# Patient Record
Sex: Female | Born: 1990 | Race: Black or African American | Hispanic: No | Marital: Single | Smoking: Never smoker
Health system: Southern US, Community
[De-identification: ages and names within clinical notes are randomized; demographics above are authoritative.]

## PROBLEM LIST (undated history)

## (undated) ENCOUNTER — Inpatient Hospital Stay (HOSPITAL_COMMUNITY): Payer: Self-pay

## (undated) DIAGNOSIS — R87629 Unspecified abnormal cytological findings in specimens from vagina: Secondary | ICD-10-CM

## (undated) DIAGNOSIS — B009 Herpesviral infection, unspecified: Secondary | ICD-10-CM

## (undated) HISTORY — PX: TONSILLECTOMY: SUR1361

---

## 2015-01-28 ENCOUNTER — Emergency Department (HOSPITAL_COMMUNITY)
Admission: EM | Admit: 2015-01-28 | Discharge: 2015-01-28 | Disposition: A | Discharge status: Home / Self Care | Attending: Emergency Medicine | Admitting: Emergency Medicine

## 2015-01-28 ENCOUNTER — Encounter (HOSPITAL_COMMUNITY): Payer: Self-pay | Admitting: Emergency Medicine

## 2015-01-28 DIAGNOSIS — Z3200 Encounter for pregnancy test, result unknown: Secondary | ICD-10-CM | POA: Diagnosis present

## 2015-01-28 DIAGNOSIS — R110 Nausea: Secondary | ICD-10-CM | POA: Insufficient documentation

## 2015-01-28 DIAGNOSIS — Z3201 Encounter for pregnancy test, result positive: Secondary | ICD-10-CM | POA: Insufficient documentation

## 2015-01-28 DIAGNOSIS — R21 Rash and other nonspecific skin eruption: Secondary | ICD-10-CM | POA: Insufficient documentation

## 2015-01-28 DIAGNOSIS — N898 Other specified noninflammatory disorders of vagina: Secondary | ICD-10-CM | POA: Insufficient documentation

## 2015-01-28 LAB — I-STAT BETA HCG BLOOD, ED (MC, WL, AP ONLY): I-stat hCG, quantitative: >2000 m[IU]/mL — ABNORMAL HIGH (ref <5)

## 2015-01-28 LAB — CBC WITH DIFFERENTIAL/PLATELET
Basophils Absolute: 0 10*3/uL (ref 0.0–0.1)
Basophils Relative: 0 % (ref 0–1)
Eosinophils Absolute: 0 10*3/uL (ref 0.0–0.7)
Eosinophils Relative: 0 % (ref 0–5)
HCT: 37.2 % (ref 36.0–46.0)
HEMOGLOBIN: 12.9 g/dL (ref 12.0–15.0)
LYMPHS ABS: 1.7 10*3/uL (ref 0.7–4.0)
LYMPHS PCT: 37 % (ref 12–46)
MCH: 29.2 pg (ref 26.0–34.0)
MCHC: 34.7 g/dL (ref 30.0–36.0)
MCV: 84.2 fL (ref 78.0–100.0)
MONO ABS: 0.5 10*3/uL (ref 0.1–1.0)
Monocytes Relative: 10 % (ref 3–12)
NEUTROS ABS: 2.5 10*3/uL (ref 1.7–7.7)
NEUTROS PCT: 53 % (ref 43–77)
Platelets: 88 10*3/uL — ABNORMAL LOW (ref 150–400)
RBC: 4.42 MIL/uL (ref 3.87–5.11)
RDW: 13 % (ref 11.5–15.5)
WBC: 4.7 10*3/uL (ref 4.0–10.5)

## 2015-01-28 LAB — WET PREP, GENITAL
CLUE CELLS WET PREP: NONE SEEN
Trich, Wet Prep: NONE SEEN
WBC, Wet Prep HPF POC: NONE SEEN
YEAST WET PREP: NONE SEEN

## 2015-01-28 LAB — BASIC METABOLIC PANEL
ANION GAP: 8 (ref 5–15)
BUN: 9 mg/dL (ref 6–20)
CHLORIDE: 108 mmol/L (ref 101–111)
CO2: 23 mmol/L (ref 22–32)
Calcium: 10.5 mg/dL — ABNORMAL HIGH (ref 8.9–10.3)
Creatinine, Ser: 0.73 mg/dL (ref 0.44–1.00)
GFR calc Af Amer: >60 mL/min (ref >60)
Glucose, Bld: 84 mg/dL (ref 65–99)
POTASSIUM: 3.8 mmol/L (ref 3.5–5.1)
Sodium: 139 mmol/L (ref 135–145)

## 2015-01-28 LAB — URINALYSIS, ROUTINE W REFLEX MICROSCOPIC
Bilirubin Urine: NEGATIVE
GLUCOSE, UA: NEGATIVE mg/dL
Hgb urine dipstick: NEGATIVE
Ketones, ur: NEGATIVE mg/dL
LEUKOCYTES UA: NEGATIVE
Nitrite: NEGATIVE
PROTEIN: NEGATIVE mg/dL
Specific Gravity, Urine: 1.016 (ref 1.005–1.030)
UROBILINOGEN UA: 0.2 mg/dL (ref 0.0–1.0)
pH: 6 (ref 5.0–8.0)

## 2015-01-28 NOTE — ED Notes (Signed)
Pt states that she hasnt had period in June but didn't have one in July. Pt states that she hasnt had Depo shot since last summer.  Pt states that in the mornings she has been nauseated that is either relieved with burping or bowel movements x week.  Pt states that she has rash in vaginal area that is itching.  Pt has tried taking OTC medications for yeast infections but thinks it has gotten worse.

## 2015-01-28 NOTE — Discharge Instructions (Signed)
Please return without fail for worsening symptoms, including abdominal pain, vaginal bleeding, fevers, passing out, or any other symptoms concerning to you. Please call your OB/GYN physician regarding her positive pregnancy test. Prenatal Care  WHAT IS PRENATAL CARE?  Prenatal care means health care during your pregnancy, before your baby is born. It is very important to take care of yourself and your baby during your pregnancy by:   Getting early prenatal care. If you know you are pregnant, or think you might be pregnant, call your health care provider as soon as possible. Schedule a visit for a prenatal exam.  Getting regular prenatal care. Follow your health care provider's schedule for blood and other necessary tests. Do not miss appointments.  Doing everything you can to keep yourself and your baby healthy during your pregnancy.  Getting complete care. Prenatal care should include evaluation of the medical, dietary, educational, psychological, and social needs of you and your significant other. The medical and genetic history of your family and the family of your baby's father should be discussed with your health care provider.  Discussing with your health care provider:  Prescription, over-the-counter, and herbal medicines that you take.  Any history of substance abuse, alcohol use, smoking, and illegal drug use.  Any history of domestic abuse and violence.  Immunizations you have received.  Your nutrition and diet.  The amount of exercise you do.  Any environmental and occupational hazards to which you are exposed.  History of sexually transmitted infections for both you and your partner.  Previous pregnancies you have had. WHY IS PRENATAL CARE SO IMPORTANT?  By regularly seeing your health care provider, you help ensure that problems can be identified early so that they can be treated as soon as possible. Other problems might be prevented. Many studies have shown that early  and regular prenatal care is important for the health of mothers and their babies.  HOW CAN I TAKE CARE OF MYSELF WHILE I AM PREGNANT?  Here are ways to take care of yourself and your baby:   Start or continue taking your multivitamin with 400 micrograms (mcg) of folic acid every day.  Get early and regular prenatal care. It is very important to see a health care provider during your pregnancy. Your health care provider will check at each visit to make sure that you and your baby are healthy. If there are any problems, action can be taken right away to help you and your baby.  Eat a healthy diet that includes:  Fruits.  Vegetables.  Foods low in saturated fat.  Whole grains.  Calcium-rich foods, such as milk, yogurt, and hard cheeses.  Drink 6-8 glasses of liquids a day.  Unless your health care provider tells you not to, try to be physically active for 30 minutes, most days of the week. If you are pressed for time, you can get your activity in through 10-minute segments, three times a day.  Do not smoke, drink alcohol, or use drugs. These can cause long-term damage to your baby. Talk with your health care provider about steps to take to stop smoking. Talk with a member of your faith community, a counselor, a trusted friend, or your health care provider if you are concerned about your alcohol or drug use.  Ask your health care provider before taking any medicine, even over-the-counter medicines. Some medicines are not safe to take during pregnancy.  Get plenty of rest and sleep.  Avoid hot tubs and saunas during pregnancy.  Do not have X-rays taken unless absolutely necessary and with the recommendation of your health care provider. A lead shield can be placed on your abdomen to protect your baby when X-rays are taken in other parts of your body.  Do not empty the cat litter when you are pregnant. It may contain a parasite that causes an infection called toxoplasmosis, which can  cause birth defects. Also, use gloves when working in garden areas used by cats.  Do not eat uncooked or undercooked meats or fish.  Do not eat soft, mold-ripened cheeses (Brie, Camembert, and chevre) or soft, blue-veined cheese (Danish blue and Roquefort).  Stay away from toxic chemicals like:  Insecticides.  Solvents (some cleaners or paint thinners).  Lead.  Mercury.  Sexual intercourse may continue until the end of the pregnancy, unless you have a medical problem or there is a problem with the pregnancy and your health care provider tells you not to.  Do not wear high-heel shoes, especially during the second half of the pregnancy. You can lose your balance and fall.  Do not take long trips, unless absolutely necessary. Be sure to see your health care provider before going on the trip.  Do not sit in one position for more than 2 hours when on a trip.  Take a copy of your medical records when going on a trip. Know where a hospital is located in the city you are visiting, in case of an emergency.  Most dangerous household products will have pregnancy warnings on their labels. Ask your health care provider about products if you are unsure.  Limit or eliminate your caffeine intake from coffee, tea, sodas, medicines, and chocolate.  Many women continue working through pregnancy. Staying active might help you stay healthier. If you have a question about the safety or the hours you work at your particular job, talk with your health care provider.  Get informed:  Read books.  Watch videos.  Go to childbirth classes for you and your significant other.  Talk with experienced moms.  Ask your health care provider about childbirth education classes for you and your partner. Classes can help you and your partner prepare for the birth of your baby.  Ask about a baby doctor (pediatrician) and methods and pain medicine for labor, delivery, and possible cesarean delivery. HOW OFTEN  SHOULD I SEE MY HEALTH CARE PROVIDER DURING PREGNANCY?  Your health care provider will give you a schedule for your prenatal visits. You will have visits more often as you get closer to the end of your pregnancy. An average pregnancy lasts about 40 weeks.  A typical schedule includes visiting your health care provider:   About once each month during your first 6 months of pregnancy.  Every 2 weeks during the next 2 months.  Weekly in the last month, until the delivery date. Your health care provider will probably want to see you more often if:  You are older than 35 years.  Your pregnancy is high risk because you have certain health problems or problems with the pregnancy, such as:  Diabetes.  High blood pressure.  The baby is not growing on schedule, according to the dates of the pregnancy. Your health care provider will do special tests to make sure you and your baby are not having any serious problems. WHAT HAPPENS DURING PRENATAL VISITS?   At your first prenatal visit, your health care provider will do a physical exam and talk to you about your health history and  the health history of your partner and your family. Your health care provider will be able to tell you what date to expect your baby to be born on.  Your first physical exam will include checks of your blood pressure, measurements of your height and weight, and an exam of your pelvic organs. Your health care provider will do a Pap test if you have not had one recently and will do cultures of your cervix to make sure there is no infection.  At each prenatal visit, there will be tests of your blood, urine, blood pressure, weight, and the progress of the baby will be checked.  At your later prenatal visits, your health care provider will check how you are doing and how your baby is developing. You may have a number of tests done as your pregnancy progresses.  Ultrasound exams are often used to check on your baby's growth and  health.  You may have more urine and blood tests, as well as special tests, if needed. These may include amniocentesis to examine fluid in the pregnancy sac, stress tests to check how the baby responds to contractions, or a biophysical profile to measure your baby's well-being. Your health care provider will explain the tests and why they are necessary.  You should be tested for high blood sugar (gestational diabetes) between the 24th and 28th weeks of your pregnancy.  You should discuss with your health care provider your plans to breastfeed or bottle-feed your baby.  Each visit is also a chance for you to learn about staying healthy during pregnancy and to ask questions. Document Released: 06/09/2003 Document Revised: 06/11/2013 Document Reviewed: 08/21/2013 [facility] Patient Information 2015 [place], [place]. This information is not intended to replace advice given to you by your health care provider. Make sure you discuss any questions you have with your health care provider.

## 2015-01-28 NOTE — ED Notes (Signed)
Patient unable to void at this time

## 2015-01-28 NOTE — ED Notes (Addendum)
Patient states she has not had a menstrual cycle since June of this year and she fears she might be pregnant.  Patient states she is not on birth control and has unprotected sex.  Patient states she had white vaginal discharge several weeks ago and treated herself with leftover Flagyl.  The discharge has resolved and she denies vaginal discharge of any kind.  Patient denies abdominal pain and cramping, but endorses N/V since yesterday.  Patient reports 2 episodes of vomiting this morning.  Patient's lung sounds are clear, heart sounds WNL, S1/S2.  Abdomen is soft and non-tender to palpation.  Bowel sounds present.

## 2015-01-28 NOTE — ED Notes (Signed)
RN ADVISED STARTING AN IV AND THAT SHE WOULD COLLECT BLOOD SAMPLES

## 2015-01-28 NOTE — ED Provider Notes (Signed)
CSN: [id number]     Arrival date & time 01/28/15  1033 History   First MD Initiated Contact with Patient 01/28/15 1051     Chief Complaint  Patient presents with  . Nausea  . Possible Pregnancy  . Rash  . Vaginal Itching     (Consider location/radiation/quality/duration/timing/severity/associated sxs/prior Treatment) HPI 24 year old female who presents with nausea and vaginal itching. For the past 1.5 weeks, has had nausea and constipated. Occasionally had vomiting, including yesterday and this morning. Denies vaginal bleeding, but has noted white discharge. She has also had vaginal itching. Sexually active with one partner, but no concern for STI. Denies fever, chills, diarrhea. Notes more constipation, but had normal bowel movement this morning. Denies abdominal pain, but feels heavy in her low abdomen. Denies dysuria or urinary frequency. No abdominal surgeries in the past  History reviewed. No pertinent past medical history. Past Surgical History  Procedure Laterality Date  . Tonsillectomy     History reviewed. No pertinent family history. Social History  Substance Use Topics  . Smoking status: Never Smoker   . Smokeless tobacco: None  . Alcohol Use: No   OB History    No data available     Review of Systems  10/14 systems reviewed and are negative other than those stated in the HPI   Allergies  Flagyl  Home Medications   Prior to Admission medications   Not on File   BP 110/57 mmHg  Pulse 66  Temp(Src) 98.5 F (36.9 C) (Oral)  Resp 16  Ht  (1.651 m)  Wt 134 lb (60.782 kg)  BMI 22.30 kg/m2  SpO2 100%  LMP 11/28/2014 Physical Exam  Physical Exam  Nursing note and vitals reviewed. Constitutional: Well developed, well nourished, non-toxic, and in no acute distress Head: Normocephalic and atraumatic.  Mouth/Throat: Oropharynx is clear and moist.  Neck: Normal range of motion. Neck supple.  Cardiovascular: Normal rate and regular rhythm.    Pulmonary/Chest: Effort normal and breath sounds normal.  Abdominal: Soft. There is no tenderness. There is no rebound and no guarding.  Musculoskeletal: Normal range of motion.  Neurological: Alert, no facial droop, fluent speech, moves all extremities symmetrically Skin: Skin is warm and dry.  Psychiatric: Cooperative Pelvic: Normal external genitalia. Normal internal genitalia. No discharge. No blood within the vagina. No cervical motion tenderness. No adnexal masses or tenderness.  ED Course  Procedures (including critical care time) Labs Review Labs Reviewed  BASIC METABOLIC PANEL - Abnormal; Notable for the following:    Calcium 10.5 (*)    All other components within normal limits  CBC WITH DIFFERENTIAL/PLATELET - Abnormal; Notable for the following:    Platelets 88 (*)    All other components within normal limits  I-STAT BETA HCG BLOOD, ED ([facility], [facility], [facility] ONLY) - Abnormal; Notable for the following:    I-stat hCG, quantitative >2000.0 (*)    All other components within normal limits  WET PREP, GENITAL  URINALYSIS, ROUTINE W REFLEX MICROSCOPIC (NOT AT [facility])  RPR  HIV ANTIBODY (ROUTINE TESTING)  GC/CHLAMYDIA PROBE AMP ([place]) NOT AT [facility]    Imaging Review No results found.   EKG Interpretation None      MDM   Final diagnoses:  Positive pregnancy test   In short, this is a 24 year old female, otherwise healthy, who presents with nausea and late menses with vaginal itching. She has non-concerning vital signs and normal abdominal exam. Pelvic exam unremarkable as well, and wet prep  is negative. GC/Chlamydia pending, but patient currently not concerned for STI. Pregnancy test is positive. Currently no abdominal pain, vaginal bleeding, or other concerning features concerning for ectopic pregnancy at this time. Discussed warning signs for ectopic and miscarriage. She has OB physician that she would like to follow-up with. Strict return and follow-up instructions reviewed.  She expressed understanding of all discharge instructions and felt comfortable with the plan of care.     Lavera Guise, MD 01/28/15 [phone]

## 2015-01-29 LAB — GC/CHLAMYDIA PROBE AMP (~~LOC~~) NOT AT ARMC
Chlamydia: NEGATIVE
Neisseria Gonorrhea: NEGATIVE

## 2015-01-29 LAB — HIV ANTIBODY (ROUTINE TESTING W REFLEX): HIV Screen 4th Generation wRfx: NONREACTIVE

## 2015-01-29 LAB — RPR: RPR Ser Ql: NONREACTIVE

## 2015-02-10 ENCOUNTER — Encounter (HOSPITAL_COMMUNITY): Payer: Self-pay | Admitting: Emergency Medicine

## 2015-02-10 ENCOUNTER — Emergency Department (HOSPITAL_COMMUNITY)
Admission: EM | Admit: 2015-02-10 | Discharge: 2015-02-10 | Discharge status: Against Medical Advice | Attending: Emergency Medicine | Admitting: Emergency Medicine

## 2015-02-10 DIAGNOSIS — O210 Mild hyperemesis gravidarum: Secondary | ICD-10-CM | POA: Diagnosis present

## 2015-02-10 DIAGNOSIS — O9989 Other specified diseases and conditions complicating pregnancy, childbirth and the puerperium: Secondary | ICD-10-CM | POA: Diagnosis not present

## 2015-02-10 DIAGNOSIS — N898 Other specified noninflammatory disorders of vagina: Secondary | ICD-10-CM | POA: Insufficient documentation

## 2015-02-10 DIAGNOSIS — Z3A12 12 weeks gestation of pregnancy: Secondary | ICD-10-CM | POA: Diagnosis not present

## 2015-02-10 DIAGNOSIS — R51 Headache: Secondary | ICD-10-CM | POA: Insufficient documentation

## 2015-02-10 NOTE — ED Notes (Signed)
Pt reports that she thinks she is approx [redacted] weeks pregnant. Today her nv is worse and now c/o of headache. Denies abdominal cramping. Pt also reporting vaginal discharge.

## 2015-02-10 NOTE — ED Notes (Signed)
Per registration Pt decided to leave due to the wait time. They informed the Pt they were next to go back and they also tried to explain the wait time and process to Pt but she decided to leave.

## 2015-02-11 ENCOUNTER — Encounter (HOSPITAL_COMMUNITY): Payer: Self-pay | Admitting: *Deleted

## 2015-02-11 ENCOUNTER — Inpatient Hospital Stay (HOSPITAL_COMMUNITY)

## 2015-02-11 ENCOUNTER — Inpatient Hospital Stay (HOSPITAL_COMMUNITY)
Admission: AD | Admit: 2015-02-11 | Discharge: 2015-02-11 | Disposition: A | Discharge status: Home / Self Care | Source: Ambulatory Visit | Attending: Obstetrics and Gynecology | Admitting: Obstetrics and Gynecology

## 2015-02-11 DIAGNOSIS — O210 Mild hyperemesis gravidarum: Secondary | ICD-10-CM | POA: Insufficient documentation

## 2015-02-11 DIAGNOSIS — O219 Vomiting of pregnancy, unspecified: Secondary | ICD-10-CM | POA: Diagnosis not present

## 2015-02-11 DIAGNOSIS — B9689 Other specified bacterial agents as the cause of diseases classified elsewhere: Secondary | ICD-10-CM

## 2015-02-11 DIAGNOSIS — N760 Acute vaginitis: Secondary | ICD-10-CM

## 2015-02-11 DIAGNOSIS — R109 Unspecified abdominal pain: Secondary | ICD-10-CM | POA: Diagnosis not present

## 2015-02-11 DIAGNOSIS — Z3491 Encounter for supervision of normal pregnancy, unspecified, first trimester: Secondary | ICD-10-CM

## 2015-02-11 DIAGNOSIS — Z3A09 9 weeks gestation of pregnancy: Secondary | ICD-10-CM | POA: Insufficient documentation

## 2015-02-11 DIAGNOSIS — O26899 Other specified pregnancy related conditions, unspecified trimester: Secondary | ICD-10-CM

## 2015-02-11 HISTORY — DX: Unspecified abnormal cytological findings in specimens from vagina: R87.629

## 2015-02-11 LAB — COMPREHENSIVE METABOLIC PANEL
ALT: 15 U/L (ref 14–54)
AST: 22 U/L (ref 15–41)
Albumin: 4.2 g/dL (ref 3.5–5.0)
Alkaline Phosphatase: 48 U/L (ref 38–126)
Anion gap: 7 (ref 5–15)
BILIRUBIN TOTAL: 0.8 mg/dL (ref 0.3–1.2)
BUN: 9 mg/dL (ref 6–20)
CO2: 24 mmol/L (ref 22–32)
Calcium: 10.8 mg/dL — ABNORMAL HIGH (ref 8.9–10.3)
Chloride: 103 mmol/L (ref 101–111)
Creatinine, Ser: 0.65 mg/dL (ref 0.44–1.00)
GFR calc Af Amer: >60 mL/min (ref >60)
Glucose, Bld: 193 mg/dL — ABNORMAL HIGH (ref 65–99)
Potassium: 4 mmol/L (ref 3.5–5.1)
Sodium: 134 mmol/L — ABNORMAL LOW (ref 135–145)
TOTAL PROTEIN: 7 g/dL (ref 6.5–8.1)

## 2015-02-11 LAB — CBC
HCT: 34.5 % — ABNORMAL LOW (ref 36.0–46.0)
HEMOGLOBIN: 12.4 g/dL (ref 12.0–15.0)
MCH: 30.2 pg (ref 26.0–34.0)
MCHC: 35.9 g/dL (ref 30.0–36.0)
MCV: 83.9 fL (ref 78.0–100.0)
Platelets: 99 10*3/uL — ABNORMAL LOW (ref 150–400)
RBC: 4.11 MIL/uL (ref 3.87–5.11)
RDW: 13.2 % (ref 11.5–15.5)
WBC: 5.7 10*3/uL (ref 4.0–10.5)

## 2015-02-11 LAB — WET PREP, GENITAL
Trich, Wet Prep: NONE SEEN
Yeast Wet Prep HPF POC: NONE SEEN

## 2015-02-11 LAB — HCG, QUANTITATIVE, PREGNANCY: HCG, BETA CHAIN, QUANT, S: 149586 m[IU]/mL — AB (ref <5)

## 2015-02-11 LAB — URINALYSIS, ROUTINE W REFLEX MICROSCOPIC
Bilirubin Urine: NEGATIVE
Glucose, UA: NEGATIVE mg/dL
HGB URINE DIPSTICK: NEGATIVE
Ketones, ur: 40 mg/dL — AB
Nitrite: NEGATIVE
Protein, ur: NEGATIVE mg/dL
SPECIFIC GRAVITY, URINE: 1.02 (ref 1.005–1.030)
Urobilinogen, UA: 0.2 mg/dL (ref 0.0–1.0)
pH: 6 (ref 5.0–8.0)

## 2015-02-11 LAB — URINE MICROSCOPIC-ADD ON

## 2015-02-11 LAB — ABO/RH: ABO/RH(D): B POS

## 2015-02-11 MED ORDER — LACTATED RINGERS IV BOLUS (SEPSIS)
1000.0000 mL | Freq: Once | INTRAVENOUS | Status: AC
  Administered 2015-02-11: 1000 mL via INTRAVENOUS

## 2015-02-11 MED ORDER — METRONIDAZOLE 0.75 % VA GEL: 1.0000 | Freq: Every day | VAGINAL | Status: DC

## 2015-02-11 MED ORDER — PROMETHAZINE HCL 25 MG PO TABS: 12.5000 mg | ORAL_TABLET | Freq: Four times a day (QID) | ORAL | Status: DC | PRN

## 2015-02-11 MED ORDER — DEXTROSE IN LACTATED RINGERS 5 % IV SOLN
25.0000 mg | Freq: Once | INTRAVENOUS | Status: AC
  Administered 2015-02-11: 25 mg via INTRAVENOUS
  Filled 2015-02-11: qty 1

## 2015-02-11 NOTE — MAU Note (Signed)
vommiting last 2 days, abd. Pain mid. Abd. Due to straining

## 2015-02-11 NOTE — MAU Provider Note (Signed)
Chief Complaint: Abdominal Pain and Emesis During Pregnancy   First Provider Initiated Contact with Patient 02/11/15 1131      SUBJECTIVE HPI: Melinda Meza is a 24 y.o. G1P0 at [redacted]w[redacted]d by LMP who presents to maternity admissions reporting nausea and vomiting x several weeks, worsening yesterday causing vomiting 8-9 x in last 24 hours.  She also reports diarrhea with 2-3 BM in last 24 hours.  The nausea/vomitting is associated with cramping abdominal pain that is intermittent.  She has not yet started prenatal care in this pregnancy and does not have a provider.  She also reports vaginal discharge with odor. She denies vaginal bleeding, vaginal itching/burning, urinary symptoms, h/a, dizziness, or fever/chills.     Abdominal Pain This is a new problem. The current episode started in the past 7 days. The onset quality is gradual. The problem occurs intermittently. The problem has been waxing and waning. The pain is located in the epigastric region, LLQ and RLQ. The pain is mild. The quality of the pain is cramping and sharp. The abdominal pain radiates to the pelvis. Associated symptoms include diarrhea, nausea and vomiting. Pertinent negatives include no constipation, dysuria, fever, frequency, headaches or weight loss. The pain is aggravated by bowel movement and vomiting. She has tried nothing for the symptoms.  Emesis  This is a recurrent problem. The current episode started 1 to 4 weeks ago. The problem occurs 5 to 10 times per day. The problem has been rapidly worsening. The emesis has an appearance of bile and stomach contents. There has been no fever. Associated symptoms include abdominal pain and diarrhea. Pertinent negatives include no chest pain, chills, dizziness, fever, headaches or weight loss. She has tried nothing for the symptoms.    Past Medical History  Diagnosis Date  . Vaginal Pap smear, abnormal    Past Surgical History  Procedure Laterality Date  . Tonsillectomy      Social History   Social History  . Marital Status: Single    Spouse Name: N/A  . Number of Children: N/A  . Years of Education: N/A   Occupational History  . Not on file.   Social History Main Topics  . Smoking status: Never Smoker   . Smokeless tobacco: Not on file  . Alcohol Use: No  . Drug Use: No  . Sexual Activity: Yes    Birth Control/ Protection: None   Other Topics Concern  . Not on file   Social History Narrative   No current facility-administered medications on file prior to encounter.   No current outpatient prescriptions on file prior to encounter.   Allergies  Allergen Reactions  . Flagyl [Metronidazole] Nausea Only    ROS:  Review of Systems  Constitutional: Negative for fever, chills, weight loss and fatigue.  HENT: Negative for sinus pressure.   Eyes: Negative for photophobia.  Respiratory: Negative for shortness of breath.   Cardiovascular: Negative for chest pain.  Gastrointestinal: Positive for nausea, vomiting, abdominal pain and diarrhea. Negative for constipation.  Genitourinary: Negative for dysuria, frequency, flank pain, vaginal bleeding, vaginal discharge, difficulty urinating, vaginal pain and pelvic pain.  Musculoskeletal: Negative for neck pain.  Neurological: Negative for dizziness, weakness and headaches.  Psychiatric/Behavioral: Negative.      I have reviewed patient's Past Medical Hx, Surgical Hx, Family Hx, Social Hx, medications and allergies.   Physical Exam   Patient Vitals for the past 24 hrs:  BP Temp Temp src Pulse Resp SpO2 Height Weight  02/11/15 1104 - - - - - -  5\' 4"  (1.626 m) 59.875 kg (132 lb)  02/11/15 1100 116/65 mmHg 98.2 F (36.8 C) Oral 74 20 100 % - -   Constitutional: Well-developed, well-nourished female in no acute distress.  Cardiovascular: normal rate Respiratory: normal effort GI: Abd soft, non-tender. Pos BS x 4 MS: Extremities nontender, no edema, normal ROM Neurologic: Alert and oriented x  4.  GU: Neg CVAT.  PELVIC EXAM: Cervix pink, visually closed, without lesion, scant white creamy discharge, vaginal walls and external genitalia normal Bimanual exam: Cervix 0/long/high, firm, anterior, neg CMT, uterus nontender, nonenlarged, adnexa without tenderness, enlargement, or mass  LAB RESULTS Results for orders placed or performed during the hospital encounter of 02/11/15 (from the past 24 hour(s))  Urinalysis, Routine w reflex microscopic (not at [facility])     Status: Abnormal   Collection Time: 02/11/15 11:10 AM  Result Value Ref Range   Color, Urine YELLOW YELLOW   APPearance CLEAR CLEAR   Specific Gravity, Urine 1.020 1.005 - 1.030   pH 6.0 5.0 - 8.0   Glucose, UA NEGATIVE NEGATIVE mg/dL   Hgb urine dipstick NEGATIVE NEGATIVE   Bilirubin Urine NEGATIVE NEGATIVE   Ketones, ur 40 (A) NEGATIVE mg/dL   Protein, ur NEGATIVE NEGATIVE mg/dL   Urobilinogen, UA 0.2 0.0 - 1.0 mg/dL   Nitrite NEGATIVE NEGATIVE   Leukocytes, UA SMALL (A) NEGATIVE  Urine microscopic-add on     Status: None   Collection Time: 02/11/15 11:10 AM  Result Value Ref Range   Squamous Epithelial / LPF RARE RARE   WBC, UA 0-2 <3 WBC/hpf  Wet prep, genital     Status: Abnormal   Collection Time: 02/11/15 12:20 PM  Result Value Ref Range   Yeast Wet Prep HPF POC NONE SEEN NONE SEEN   Trich, Wet Prep NONE SEEN NONE SEEN   Clue Cells Wet Prep HPF POC FEW (A) NONE SEEN   WBC, Wet Prep HPF POC FEW (A) NONE SEEN       IMAGING US Ob Comp Less 14 Wks  02/11/2015   CLINICAL DATA:  Initial encounter for 3 day history pelvic pain  EXAM: OBSTETRIC <14 WK ULTRASOUND  TECHNIQUE: Transabdominal ultrasound was performed for evaluation of the gestation as well as the maternal uterus and adnexal regions.  COMPARISON:  None.  FINDINGS: Intrauterine gestational sac: Visualized/normal in shape.  Yolk sac:  Visualized.  Embryo:  Visualized.  Cardiac Activity: Visualized.  Heart Rate: 177  bpm  CRL:  22.5  mm   8 w   6 d                   [place] EDC: 09/17/2015  Maternal uterus/adnexae: Small subchronic hemorrhage is evident. Maternal right ovary is normal in appearance. Left ovary is not visualized. No intraperitoneal free fluid.  IMPRESSION: Single living intrauterine gestation at estimated 8 week 6 day gestational age by crown-rump length.   Electronically Signed   By: Kennith Center M.D.   On: 02/11/2015 14:27    MAU Management/MDM: Ordered labs and imaging and reviewed results.   Treatments in MAU included D5LR x 1000 ml, LR x 1000 ml, and Phenergan 25 mg IV.  Pt reports improvement in nausea and has not reported vomiting in the MAU. Pt stable at time of discharge.  ASSESSMENT 1. Nausea and vomiting during pregnancy prior to [redacted] weeks gestation   2. Abdominal pain affecting pregnancy, antepartum   3. Normal IUP (intrauterine pregnancy) on prenatal ultrasound, first trimester  PLAN Discharge home Phenergan 12.5-25 mg PO Q 6 hours PRN Discussed OTC nausea medications including Unisom/B6 Discussed SCH findings on [place] with pt and bleeding precautions given List of prenatal providers given    Medication List    TAKE these medications        promethazine 25 MG tablet  Commonly known as:  PHENERGAN  Take 0.5-1 tablets (12.5-25 mg total) by mouth every 6 (six) hours as needed.       Follow-up Information    Please follow up.   Why:  Start prenatal care as soon as possible      Follow up with [facility] [facility]ERN[facility] ADMISS[facility].   Why:  As needed for emergencies   Contact information:   [address] [id number] mc [place] [address] [phone]      Sharen Counter Certified Nurse-Midwife 02/11/2015  2:54 PM

## 2015-02-11 NOTE — Discharge Instructions (Signed)
Nausea medication to take during pregnancy:  ° °Unisom (doxylamine succinate 25 mg tablets) Take one tablet daily at bedtime. If symptoms are not adequately controlled, the dose can be increased to a maximum recommended dose of two tablets daily (1/2 tablet in the morning, 1/2 tablet mid-afternoon and one at bedtime). ° °Vitamin B6 100mg tablets. Take one tablet twice a day (up to 200 mg per day). ° °Add Phenergan as prescribed to take as needed.  ° °Morning Sickness °Morning sickness is when you feel sick to your stomach (nauseous) during pregnancy. This nauseous feeling may or may not come with vomiting. It often occurs in the morning but can be a problem any time of day. Morning sickness is most common during the first trimester, but it may continue throughout pregnancy. While morning sickness is unpleasant, it is usually harmless unless you develop severe and continual vomiting (hyperemesis gravidarum). This condition requires more intense treatment.  °CAUSES  °The cause of morning sickness is not completely known but seems to be related to normal hormonal changes that occur in pregnancy. °RISK FACTORS °You are at greater risk if you: °· Experienced nausea or vomiting before your pregnancy. °· Had morning sickness during a previous pregnancy. °· Are pregnant with more than one baby, such as twins. °TREATMENT  °Do not use any medicines (prescription, over-the-counter, or herbal) for morning sickness without first talking to your health care provider. Your health care provider may prescribe or recommend: °· Vitamin B6 supplements. °· Anti-nausea medicines. °· The herbal medicine ginger. °HOME CARE INSTRUCTIONS  °· Only take over-the-counter or prescription medicines as directed by your health care provider. °· Taking multivitamins before getting pregnant can prevent or decrease the severity of morning sickness in most women. °· Eat a piece of dry toast or unsalted crackers before getting out of bed in the  morning. °· Eat five or six small meals a day. °· Eat dry and bland foods (rice, baked potato). Foods high in carbohydrates are often helpful. °· Do not drink liquids with your meals. Drink liquids between meals. °· Avoid greasy, fatty, and spicy foods. °· Get someone to cook for you if the smell of any food causes nausea and vomiting. °· If you feel nauseous after taking prenatal vitamins, take the vitamins at night or with a snack.  °· Snack on protein foods (nuts, yogurt, cheese) between meals if you are hungry. °· Eat unsweetened gelatins for desserts. °· Wearing an acupressure wristband (worn for sea sickness) may be helpful. °· Acupuncture may be helpful. °· Do not smoke. °· Get a humidifier to keep the air in your house free of odors. °· Get plenty of fresh air. °SEEK MEDICAL CARE IF:  °· Your home remedies are not working, and you need medicine. °· You feel dizzy or lightheaded. °· You are losing weight. °SEEK IMMEDIATE MEDICAL CARE IF:  °· You have persistent and uncontrolled nausea and vomiting. °· You pass out (faint). °MAKE SURE YOU: °· Understand these instructions. °· Will watch your condition. °· Will get help right away if you are not doing well or get worse. °Document Released: 07/28/2006 Document Revised: 06/11/2013 Document Reviewed: 11/21/2012 °ExitCare® Patient Information ©2015 [employer]. This information is not intended to replace advice given to you by your health care provider. Make sure you discuss any questions you have with your health care provider. ° °

## 2015-02-12 LAB — GC/CHLAMYDIA PROBE AMP (~~LOC~~) NOT AT ARMC
Chlamydia: NEGATIVE
Neisseria Gonorrhea: NEGATIVE

## 2015-02-12 LAB — HIV ANTIBODY (ROUTINE TESTING W REFLEX): HIV SCREEN 4TH GENERATION: NONREACTIVE

## 2015-12-17 ENCOUNTER — Encounter (HOSPITAL_COMMUNITY): Payer: Self-pay | Admitting: *Deleted

## 2016-06-20 NOTE — L&D Delivery Note (Signed)
Delivery Note At 3:06 PM a viable female Theora Gianotti)  was delivered via Vaginal, Spontaneous Delivery (Presentation: occiput anterior  ;  ).  APGAR: 9, 9; weight 7 lb 13 oz (3545 g).   Placenta status:Retained.... Informed consent obtained for manual removal of the placenta. Pt was given Nitroglycerine sublingual by Dr. Hart Rochester with Anesthesia. The placenta was then manually removed without difficulty. Placenta appeared complete.Retained membranes noted and removed with ring forceps.  , .    Anesthesia:Nitrous Oxide during delivery and Fentanyl post delivery    Episiotomy: None Lacerations: 2nd degree Suture Repair: 3.0 vicryl Est. Blood Loss (mL): 600 mL  Pt with postpartum hemorrhage due to atony after placenta removed. Pt received cytotec 1000 mcg per rectum and methergine 0.2 mg IM  Mom to postpartum.  Baby to Couplet care / Skin to Skin.  Flo Berroa J. 03/27/2017, 4:26 PM

## 2016-09-13 LAB — OB RESULTS CONSOLE RPR: RPR: NONREACTIVE

## 2016-09-13 LAB — OB RESULTS CONSOLE ABO/RH: RH Type: POSITIVE

## 2016-09-13 LAB — OB RESULTS CONSOLE ANTIBODY SCREEN: Antibody Screen: NEGATIVE

## 2016-09-13 LAB — OB RESULTS CONSOLE HIV ANTIBODY (ROUTINE TESTING): HIV: NONREACTIVE

## 2016-09-13 LAB — OB RESULTS CONSOLE HEPATITIS B SURFACE ANTIGEN: Hepatitis B Surface Ag: NEGATIVE

## 2016-09-13 LAB — OB RESULTS CONSOLE RUBELLA ANTIBODY, IGM: RUBELLA: IMMUNE

## 2016-09-20 ENCOUNTER — Other Ambulatory Visit (HOSPITAL_COMMUNITY)
Admission: RE | Admit: 2016-09-20 | Discharge: 2016-09-20 | Disposition: A | Discharge status: Home / Self Care | Payer: BLUE CROSS/BLUE SHIELD | Source: Ambulatory Visit | Attending: Obstetrics and Gynecology | Admitting: Obstetrics and Gynecology

## 2016-09-20 DIAGNOSIS — Z113 Encounter for screening for infections with a predominantly sexual mode of transmission: Secondary | ICD-10-CM | POA: Diagnosis present

## 2016-09-20 DIAGNOSIS — Z01419 Encounter for gynecological examination (general) (routine) without abnormal findings: Secondary | ICD-10-CM | POA: Diagnosis present

## 2016-09-21 ENCOUNTER — Other Ambulatory Visit: Payer: Self-pay | Admitting: Obstetrics and Gynecology

## 2016-09-26 LAB — CYTOLOGY - PAP
CHLAMYDIA, DNA PROBE: NEGATIVE
Diagnosis: NEGATIVE
NEISSERIA GONORRHEA: NEGATIVE

## 2016-11-09 IMAGING — US US OB COMP LESS 14 WK
1 series · 15 of 21 positions shown · non-contrast
Comparison: None.

CLINICAL DATA: Initial encounter for 3 day history pelvic pain

EXAM:
OBSTETRIC <14 WK ULTRASOUND
TECHNIQUE: Transabdominal ultrasound was performed for evaluation of the
gestation as well as the maternal uterus and adnexal regions.

[Series 1: us ob comp less 14 wk · 15 of 21 slices shown]
[im 1/21]
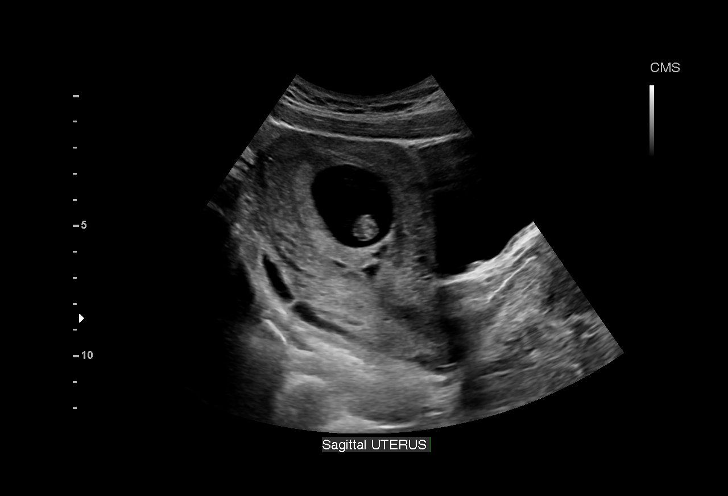
[im 3/21]
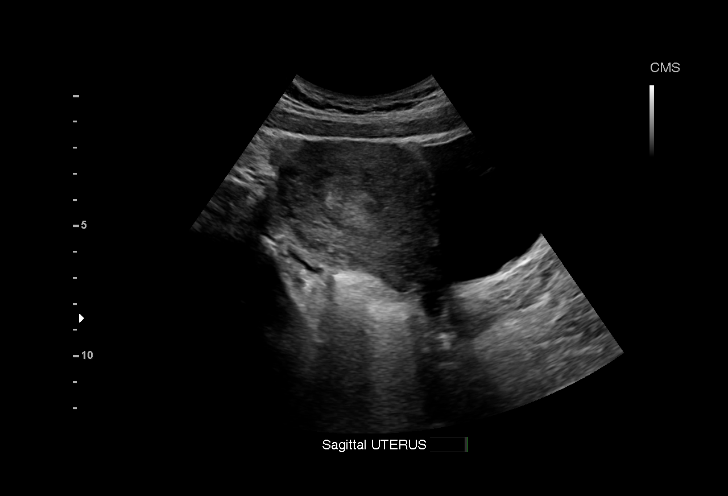
[im 4/21]
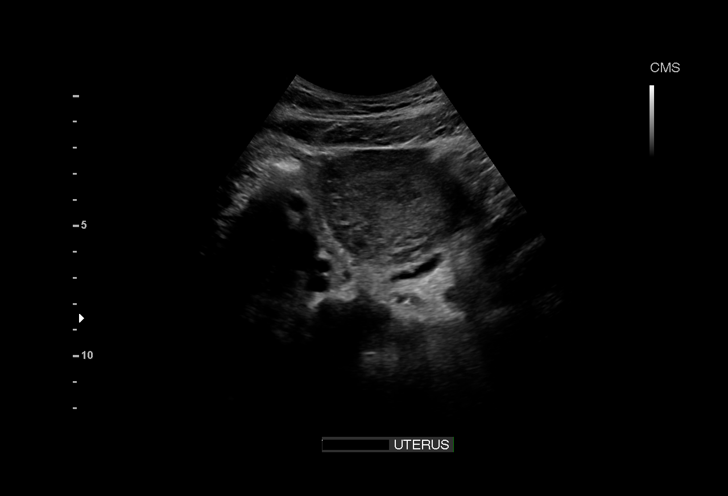
[im 5/21]
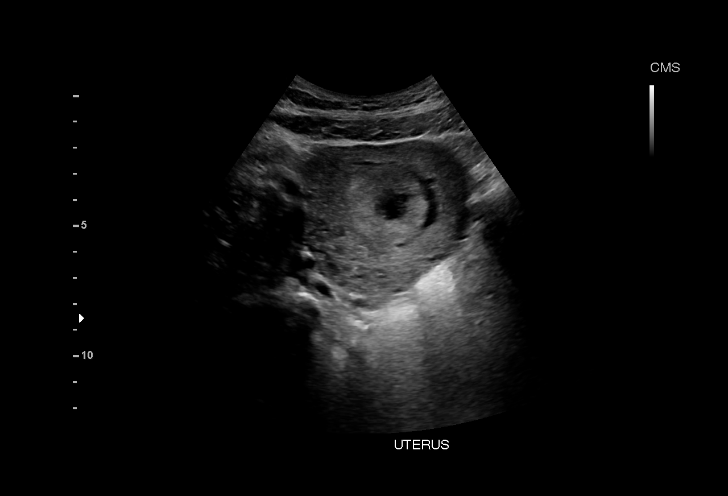
[im 7/21]
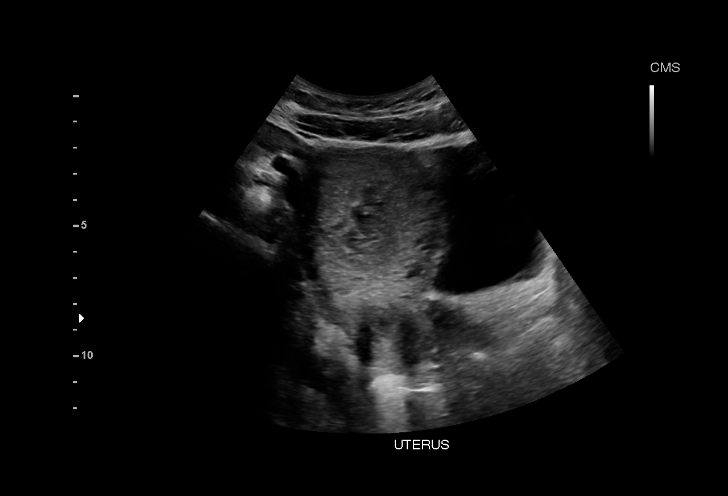
[im 8/21]
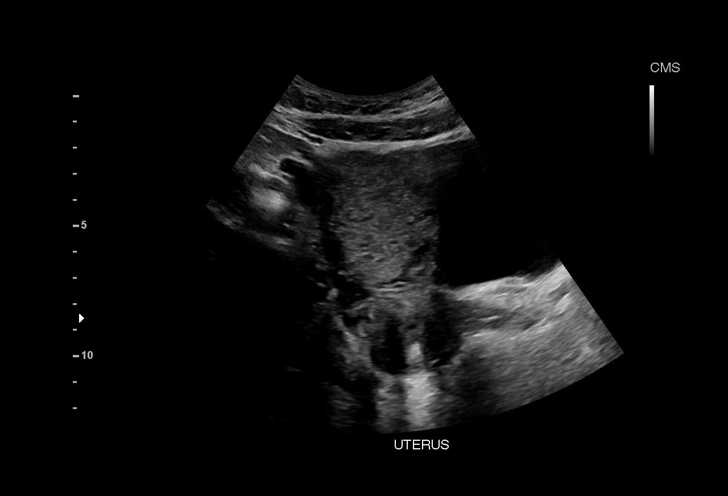
[im 10/21]
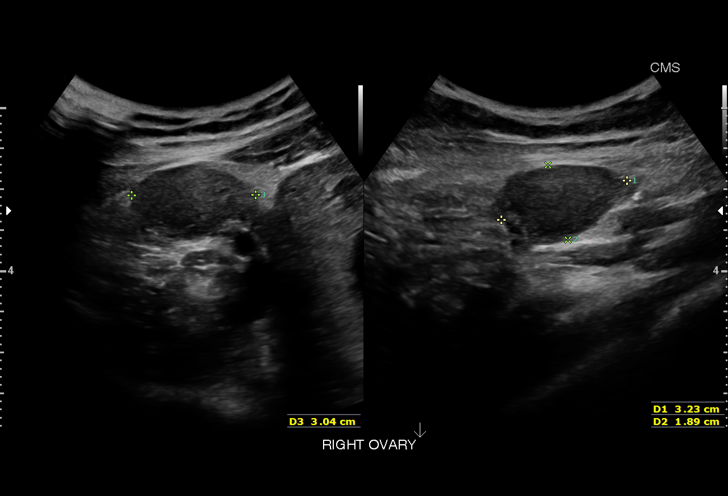
[im 11/21]
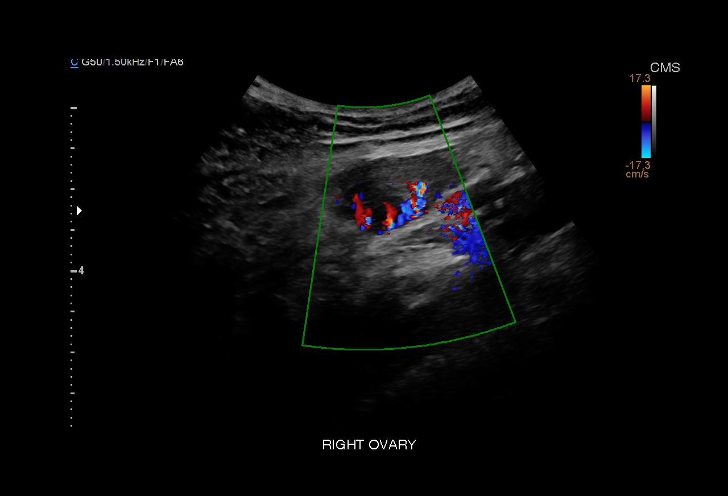
[im 12/21]
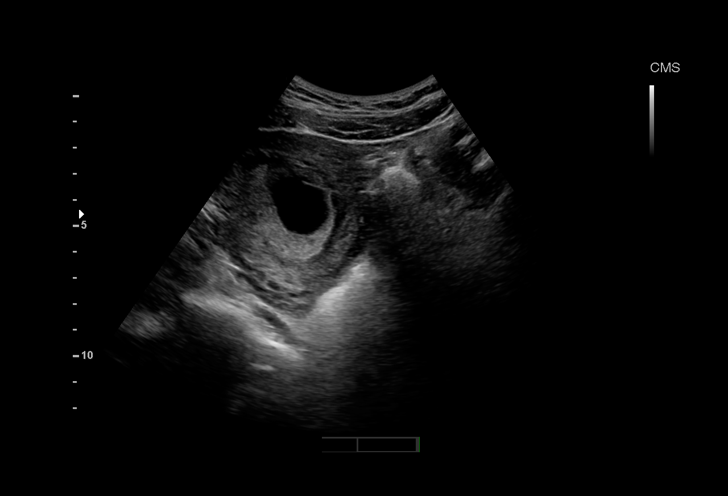
[im 14/21]
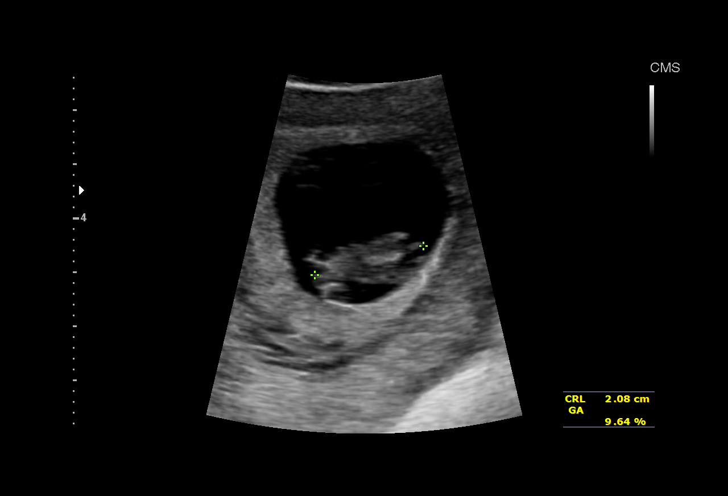
[im 15/21]
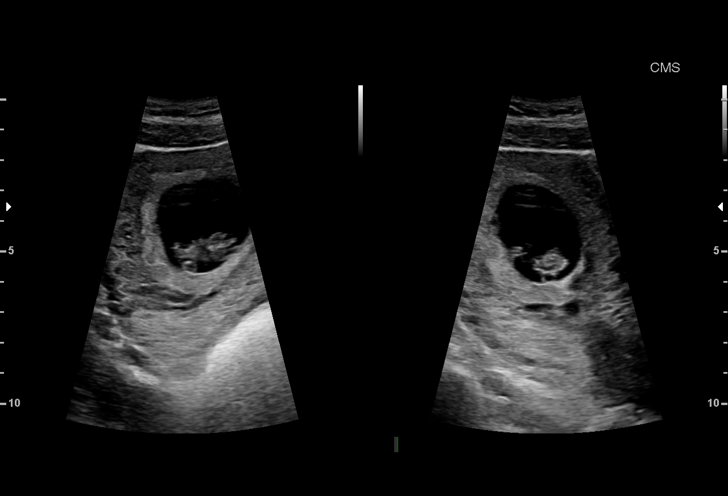
[im 17/21]
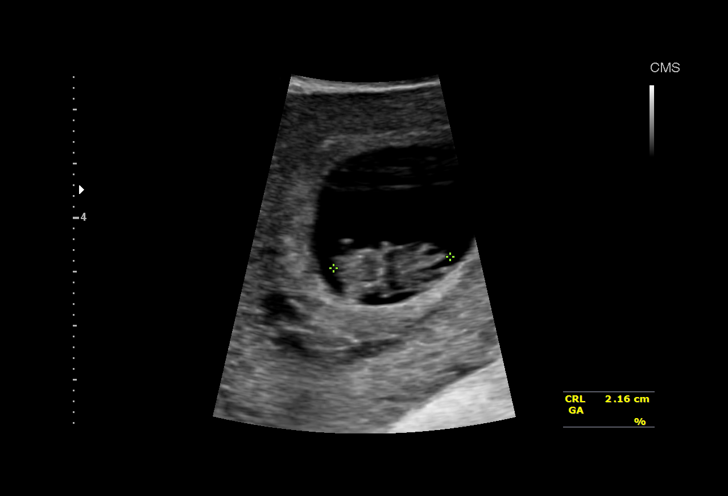
[im 18/21]
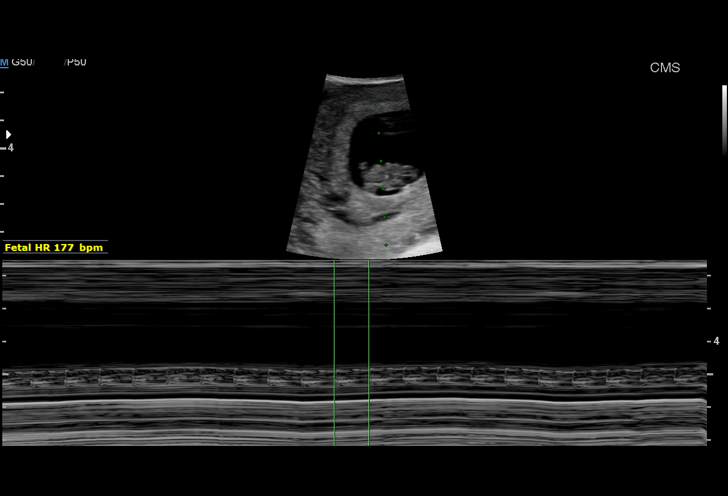
[im 19/21]
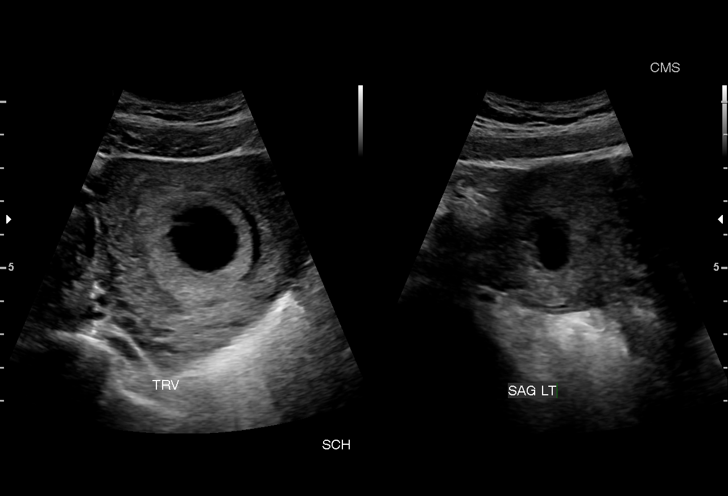
[im 21/21]
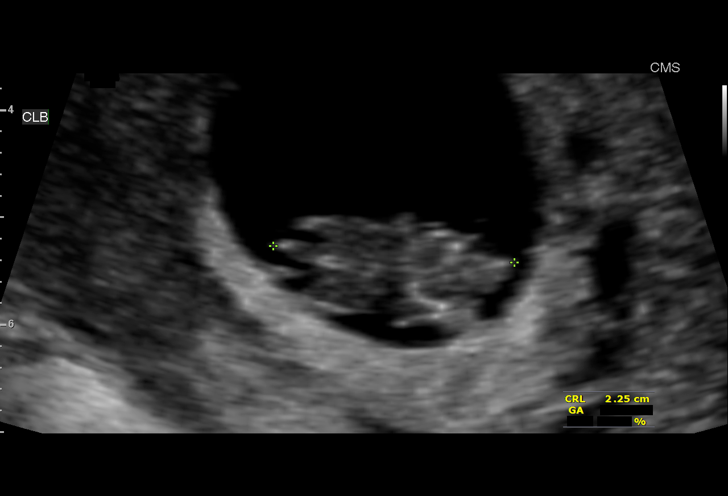

[15 of 21 positions shown; findings below may reference images not displayed]

FINDINGS: Intrauterine gestational sac: Visualized/normal in shape.

Yolk sac:  Visualized.

Embryo:  Visualized.

Cardiac Activity: Visualized.

Heart Rate: 177  bpm

CRL:  22.5  mm   8 w   6 d                  US EDC: 09/17/2015

Maternal uterus/adnexae: Small subchronic hemorrhage is evident.
Maternal right ovary is normal in appearance. Left ovary is not
visualized. No intraperitoneal free fluid.
IMPRESSION: Single living intrauterine gestation at estimated 8 week 6 day
gestational age by crown-rump length.

## 2017-01-25 ENCOUNTER — Other Ambulatory Visit: Payer: Self-pay | Admitting: Obstetrics & Gynecology

## 2017-03-10 LAB — OB RESULTS CONSOLE GBS: STREP GROUP B AG: NEGATIVE

## 2017-03-22 ENCOUNTER — Telehealth: Payer: Self-pay | Admitting: Hematology

## 2017-03-22 ENCOUNTER — Encounter: Payer: Self-pay | Admitting: Hematology

## 2017-03-22 NOTE — Telephone Encounter (Signed)
Received a call from Ariel from Dr. Dawayne Patricia office to schedule the pt an appt. Pt is 39 weeks and Dr. Richardson Dopp needed for her to be seen soon. Appt has been scheduled for the pt to see Dr. Candise Che on 10/9 at 1pm. Ariel will notify the pt. Letter mailed.

## 2017-03-27 ENCOUNTER — Inpatient Hospital Stay (HOSPITAL_COMMUNITY)
Admission: AD | Admit: 2017-03-27 | Discharge: 2017-03-29 | DRG: 806 | Disposition: A | Discharge status: Home / Self Care | Payer: Medicaid Other | Source: Ambulatory Visit | Attending: Obstetrics and Gynecology | Admitting: Obstetrics and Gynecology

## 2017-03-27 ENCOUNTER — Other Ambulatory Visit: Payer: Self-pay | Admitting: Obstetrics and Gynecology

## 2017-03-27 ENCOUNTER — Encounter (HOSPITAL_COMMUNITY): Payer: Self-pay

## 2017-03-27 DIAGNOSIS — D6959 Other secondary thrombocytopenia: Secondary | ICD-10-CM | POA: Diagnosis present

## 2017-03-27 DIAGNOSIS — Z3A39 39 weeks gestation of pregnancy: Secondary | ICD-10-CM

## 2017-03-27 DIAGNOSIS — O9912 Other diseases of the blood and blood-forming organs and certain disorders involving the immune mechanism complicating childbirth: Secondary | ICD-10-CM | POA: Diagnosis present

## 2017-03-27 DIAGNOSIS — A6000 Herpesviral infection of urogenital system, unspecified: Secondary | ICD-10-CM | POA: Diagnosis present

## 2017-03-27 DIAGNOSIS — O9832 Other infections with a predominantly sexual mode of transmission complicating childbirth: Secondary | ICD-10-CM | POA: Diagnosis present

## 2017-03-27 DIAGNOSIS — O26893 Other specified pregnancy related conditions, third trimester: Secondary | ICD-10-CM | POA: Diagnosis present

## 2017-03-27 HISTORY — DX: Herpesviral infection, unspecified: B00.9

## 2017-03-27 LAB — RAPID URINE DRUG SCREEN, HOSP PERFORMED
AMPHETAMINES: NOT DETECTED
BENZODIAZEPINES: NOT DETECTED
Barbiturates: NOT DETECTED
COCAINE: NOT DETECTED
OPIATES: NOT DETECTED
Tetrahydrocannabinol: NOT DETECTED

## 2017-03-27 LAB — CBC
HEMATOCRIT: 38.2 % (ref 36.0–46.0)
HEMOGLOBIN: 13.6 g/dL (ref 12.0–15.0)
MCH: 31.5 pg (ref 26.0–34.0)
MCHC: 35.6 g/dL (ref 30.0–36.0)
MCV: 88.4 fL (ref 78.0–100.0)
Platelets: 68 10*3/uL — ABNORMAL LOW (ref 150–400)
RBC: 4.32 MIL/uL (ref 3.87–5.11)
RDW: 14.7 % (ref 11.5–15.5)
WBC: 6.3 10*3/uL (ref 4.0–10.5)

## 2017-03-27 LAB — COMPREHENSIVE METABOLIC PANEL
ALBUMIN: 3.4 g/dL — AB (ref 3.5–5.0)
ALK PHOS: 202 U/L — AB (ref 38–126)
ALT: 11 U/L — ABNORMAL LOW (ref 14–54)
ANION GAP: 6 (ref 5–15)
AST: 23 U/L (ref 15–41)
BILIRUBIN TOTAL: 0.7 mg/dL (ref 0.3–1.2)
BUN: 6 mg/dL (ref 6–20)
CALCIUM: 11.2 mg/dL — AB (ref 8.9–10.3)
CO2: 21 mmol/L — ABNORMAL LOW (ref 22–32)
Chloride: 111 mmol/L (ref 101–111)
Creatinine, Ser: 0.66 mg/dL (ref 0.44–1.00)
GFR calc Af Amer: >60 mL/min (ref >60)
GFR calc non Af Amer: >60 mL/min (ref >60)
GLUCOSE: 91 mg/dL (ref 65–99)
Potassium: 3.8 mmol/L (ref 3.5–5.1)
Sodium: 138 mmol/L (ref 135–145)
TOTAL PROTEIN: 6.8 g/dL (ref 6.5–8.1)

## 2017-03-27 LAB — PREPARE RBC (CROSSMATCH)

## 2017-03-27 MED ORDER — LACTATED RINGERS IV SOLN: INTRAVENOUS | Status: DC

## 2017-03-27 MED ORDER — ACETAMINOPHEN 325 MG PO TABS
650.0000 mg | ORAL_TABLET | ORAL | Status: DC | PRN
  Administered 2017-03-27 – 2017-03-29 (×7): 650 mg via ORAL
  Filled 2017-03-27 (×7): qty 2

## 2017-03-27 MED ORDER — SOD CITRATE-CITRIC ACID 500-334 MG/5ML PO SOLN: 30.0000 mL | ORAL | Status: DC | PRN

## 2017-03-27 MED ORDER — FENTANYL CITRATE (PF) 100 MCG/2ML IJ SOLN
50.0000 ug | INTRAMUSCULAR | Status: DC | PRN
  Administered 2017-03-27: 100 ug via INTRAVENOUS
  Filled 2017-03-27: qty 2

## 2017-03-27 MED ORDER — NITROGLYCERIN 0.4 MG/SPRAY TL SOLN
Status: AC
  Filled 2017-03-27: qty 4.9

## 2017-03-27 MED ORDER — SIMETHICONE 80 MG PO CHEW: 80.0000 mg | CHEWABLE_TABLET | ORAL | Status: DC | PRN

## 2017-03-27 MED ORDER — ACETAMINOPHEN 325 MG PO TABS: 650.0000 mg | ORAL_TABLET | ORAL | Status: DC | PRN

## 2017-03-27 MED ORDER — METHYLERGONOVINE MALEATE 0.2 MG/ML IJ SOLN: 0.2000 mg | Freq: Four times a day (QID) | INTRAMUSCULAR | Status: AC

## 2017-03-27 MED ORDER — LIDOCAINE HCL (PF) 1 % IJ SOLN
30.0000 mL | INTRAMUSCULAR | Status: AC | PRN
  Administered 2017-03-27: 30 mL via SUBCUTANEOUS
  Filled 2017-03-27: qty 30

## 2017-03-27 MED ORDER — OXYTOCIN BOLUS FROM INFUSION
500.0000 mL | Freq: Once | INTRAVENOUS | Status: AC
  Administered 2017-03-27: 500 mL via INTRAVENOUS

## 2017-03-27 MED ORDER — METHYLERGONOVINE MALEATE 0.2 MG/ML IJ SOLN
INTRAMUSCULAR | Status: AC
  Administered 2017-03-27: 0.2 mg
  Filled 2017-03-27: qty 1

## 2017-03-27 MED ORDER — COCONUT OIL OIL
1.0000 "application " | TOPICAL_OIL | Status: DC | PRN
  Administered 2017-03-28: 1 via TOPICAL

## 2017-03-27 MED ORDER — OXYCODONE-ACETAMINOPHEN 5-325 MG PO TABS: 1.0000 | ORAL_TABLET | ORAL | Status: DC | PRN

## 2017-03-27 MED ORDER — LACTATED RINGERS IV SOLN: 500.0000 mL | INTRAVENOUS | Status: DC | PRN

## 2017-03-27 MED ORDER — DIBUCAINE 1 % RE OINT
1.0000 "application " | TOPICAL_OINTMENT | RECTAL | Status: DC | PRN
  Administered 2017-03-29: 1 via RECTAL
  Filled 2017-03-27: qty 28

## 2017-03-27 MED ORDER — DIPHENHYDRAMINE HCL 25 MG PO CAPS: 25.0000 mg | ORAL_CAPSULE | Freq: Four times a day (QID) | ORAL | Status: DC | PRN

## 2017-03-27 MED ORDER — OXYTOCIN 40 UNITS IN LACTATED RINGERS INFUSION - SIMPLE MED
2.5000 [IU]/h | INTRAVENOUS | Status: DC
  Filled 2017-03-27: qty 1000

## 2017-03-27 MED ORDER — ONDANSETRON HCL 4 MG/2ML IJ SOLN: 4.0000 mg | Freq: Four times a day (QID) | INTRAMUSCULAR | Status: DC | PRN

## 2017-03-27 MED ORDER — BENZOCAINE-MENTHOL 20-0.5 % EX AERO
1.0000 "application " | INHALATION_SPRAY | CUTANEOUS | Status: DC | PRN
  Filled 2017-03-27: qty 56

## 2017-03-27 MED ORDER — MISOPROSTOL 200 MCG PO TABS
ORAL_TABLET | ORAL | Status: AC
  Administered 2017-03-27: 1000 ug
  Filled 2017-03-27: qty 5

## 2017-03-27 MED ORDER — OXYCODONE-ACETAMINOPHEN 5-325 MG PO TABS: 2.0000 | ORAL_TABLET | ORAL | Status: DC | PRN

## 2017-03-27 MED ORDER — PHENYLEPHRINE 40 MCG/ML (10ML) SYRINGE FOR IV PUSH (FOR BLOOD PRESSURE SUPPORT)
PREFILLED_SYRINGE | INTRAVENOUS | Status: AC
  Filled 2017-03-27: qty 10

## 2017-03-27 MED ORDER — ONDANSETRON HCL 4 MG/2ML IJ SOLN: 4.0000 mg | INTRAMUSCULAR | Status: DC | PRN

## 2017-03-27 MED ORDER — METHYLERGONOVINE MALEATE 0.2 MG PO TABS
0.2000 mg | ORAL_TABLET | Freq: Four times a day (QID) | ORAL | Status: AC
  Administered 2017-03-27 – 2017-03-29 (×6): 0.2 mg via ORAL
  Filled 2017-03-27 (×6): qty 1

## 2017-03-27 MED ORDER — WITCH HAZEL-GLYCERIN EX PADS
1.0000 "application " | MEDICATED_PAD | CUTANEOUS | Status: DC | PRN
  Administered 2017-03-29 (×2): 1 via TOPICAL

## 2017-03-27 MED ORDER — PRENATAL MULTIVITAMIN CH
1.0000 | ORAL_TABLET | Freq: Every day | ORAL | Status: DC
  Administered 2017-03-28 – 2017-03-29 (×2): 1 via ORAL
  Filled 2017-03-27 (×2): qty 1

## 2017-03-27 MED ORDER — SENNOSIDES-DOCUSATE SODIUM 8.6-50 MG PO TABS
2.0000 | ORAL_TABLET | ORAL | Status: DC
  Administered 2017-03-27 – 2017-03-28 (×2): 2 via ORAL
  Filled 2017-03-27 (×2): qty 2

## 2017-03-27 MED ORDER — ONDANSETRON HCL 4 MG PO TABS: 4.0000 mg | ORAL_TABLET | ORAL | Status: DC | PRN

## 2017-03-27 MED ORDER — SODIUM CHLORIDE 0.9 % IV SOLN: Freq: Once | INTRAVENOUS | Status: DC

## 2017-03-27 MED ORDER — IBUPROFEN 600 MG PO TABS: 600.0000 mg | ORAL_TABLET | Freq: Four times a day (QID) | ORAL | Status: DC

## 2017-03-27 MED ORDER — ACETAMINOPHEN 500 MG PO TABS: 1000.0000 mg | ORAL_TABLET | Freq: Four times a day (QID) | ORAL | Status: DC | PRN

## 2017-03-27 MED ORDER — ZOLPIDEM TARTRATE 5 MG PO TABS: 5.0000 mg | ORAL_TABLET | Freq: Every evening | ORAL | Status: DC | PRN

## 2017-03-27 NOTE — H&P (Signed)
Melinda Meza is a 26 y.o. female G3P0020 at 39 wks and 6 days  presenting for Active labor. Pt was seen in the office to day for routine visit. She reported contractions every 3-5 minutes sine 7 am. Cervix 4-5/90/-1 Bulging bag. Pt was directly admitted to labor and delivery. 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 She denies current lesions. +FM  . OB History    Gravida Para Term Preterm AB Living   2         0   SAB TAB Ectopic Multiple Live Births                 Past Medical History:  Diagnosis Date  . Herpes   . Vaginal Pap smear, abnormal    Past Surgical History:  Procedure Laterality Date  . TONSILLECTOMY     Family History: family history is not on file. Social History:  reports that she has never smoked. She does not have any smokeless tobacco history on file. She reports that she does not drink alcohol or use drugs.     Maternal Diabetes: No Genetic Screening: Normal Maternal Ultrasounds/Referrals: Normal Fetal Ultrasounds or other Referrals:  None Maternal Substance Abuse:  Yes:  Type: Marijuana Significant Maternal Medications:  None Significant Maternal Lab Results:  Lab values include: Group B Strep negative Other Comments:  None  Review of Systems  Constitutional: Negative.   HENT: Negative.   Eyes: Negative.   Respiratory: Negative.   Cardiovascular: Negative.   Gastrointestinal: Negative.   Genitourinary: Negative.   Musculoskeletal: Negative.   Skin: Negative.   Neurological: Negative.   Endo/Heme/Allergies: Negative.   Psychiatric/Behavioral: Negative.    Maternal Medical History:  Reason for admission: Contractions.   Contractions: Onset was 3-5 hours ago.   Frequency: regular.   Perceived severity is strong.    Fetal activity: Perceived fetal activity is normal.    Prenatal complications: Thrombocytopenia.   Prenatal Complications - Diabetes: none.    Dilation: 7 Effacement (%):  100 Station: -1 Exam by:: Lauren mcdaniel rn Blood pressure 128/75, pulse 70, temperature 98 F (36.7 C), temperature source Axillary, resp. rate 18, height  (1.676 m), weight 92.1 kg (203 lb), last menstrual period 06/21/2016, unknown if currently breastfeeding. Exam Physical Exam  Vitals reviewed. Constitutional: She is oriented to person, place, and time. She appears well-developed and well-nourished.  HENT:  Head: Normocephalic and atraumatic.  Eyes: Pupils are equal, round, and reactive to light.  Neck: Normal range of motion. Neck supple.  Cardiovascular: Normal rate and regular rhythm.   Respiratory: Effort normal and breath sounds normal.  GI: There is no tenderness.  Musculoskeletal: Normal range of motion.  Neurological: She is alert and oriented to person, place, and time. She has normal reflexes.  Skin: Skin is warm and dry.  Psychiatric: She has a normal mood and affect.    Prenatal labs: ABO, Rh: --/--/B POS (10/08 1030) Antibody: NEG (10/08 1030) Rubella: @ RPR: Nonreactive (03/27 0000)  HBsAg: Negative (03/27 0000)  HIV: Non-reactive (03/27 0000)  GBS: Negative (09/21 0000)   Assessment/Plan: 39 wks and 6 day in active labor  Thrombocytopenia ( Gestational vs ITP- platelets too low for epidural.  Pain control nitrous oxide/ Fentanyl. Prn Anticipate SVD    Melinda Meza J. 03/27/2017, 1:37 PM

## 2017-03-27 NOTE — Progress Notes (Signed)
Delivery of live viable female by Dr Richardson Dopp. APGARS 8,9

## 2017-03-27 NOTE — Progress Notes (Signed)
Dr Dion Body notified of Pt temp of 100.2 and mod lochia at this time. No orders received.

## 2017-03-27 NOTE — Anesthesia Pain Management Evaluation Note (Signed)
  CRNA Pain Management Visit Note  Patient: Melinda Meza, 26 y.o., female  "Hello I am a member of the anesthesia team at [facility]. We have an anesthesia team available at all times to provide care throughout the hospital, including epidural management and anesthesia for C-section. I don't know your plan for the delivery whether it a natural birth, water birth, IV sedation, nitrous supplementation, doula or epidural, but we want to meet your pain goals."   1.Was your pain managed to your expectations on prior hospitalizations?   yes  2.What is your expectation for pain management during this hospitalization?      yes 3.How can we help you reach that goal? Nursing intervention  Record the patient's initial score and the patient's pain goal.   Pain: 10/10  Pain Goal: 10/10 [facility] wants you to be able to say your pain was always managed very well.  Salome Arnt 03/27/2017

## 2017-03-28 ENCOUNTER — Encounter (HOSPITAL_COMMUNITY): Payer: Self-pay

## 2017-03-28 ENCOUNTER — Encounter: Payer: Medicaid Other | Admitting: Hematology

## 2017-03-28 LAB — COMPREHENSIVE METABOLIC PANEL
ALT: 12 U/L — ABNORMAL LOW (ref 14–54)
AST: 32 U/L (ref 15–41)
Albumin: 2.9 g/dL — ABNORMAL LOW (ref 3.5–5.0)
Alkaline Phosphatase: 160 U/L — ABNORMAL HIGH (ref 38–126)
Anion gap: 6 (ref 5–15)
BUN: 8 mg/dL (ref 6–20)
CHLORIDE: 106 mmol/L (ref 101–111)
CO2: 24 mmol/L (ref 22–32)
Calcium: 11.4 mg/dL — ABNORMAL HIGH (ref 8.9–10.3)
Creatinine, Ser: 0.81 mg/dL (ref 0.44–1.00)
GFR calc Af Amer: >60 mL/min (ref >60)
GFR calc non Af Amer: >60 mL/min (ref >60)
GLUCOSE: 82 mg/dL (ref 65–99)
POTASSIUM: 3.9 mmol/L (ref 3.5–5.1)
Sodium: 136 mmol/L (ref 135–145)
Total Bilirubin: 0.7 mg/dL (ref 0.3–1.2)
Total Protein: 5.9 g/dL — ABNORMAL LOW (ref 6.5–8.1)

## 2017-03-28 LAB — CBC
HCT: 37.9 % (ref 36.0–46.0)
Hemoglobin: 13.7 g/dL (ref 12.0–15.0)
MCH: 31.8 pg (ref 26.0–34.0)
MCHC: 36.1 g/dL — ABNORMAL HIGH (ref 30.0–36.0)
MCV: 87.9 fL (ref 78.0–100.0)
PLATELETS: 67 10*3/uL — AB (ref 150–400)
RBC: 4.31 MIL/uL (ref 3.87–5.11)
RDW: 14.4 % (ref 11.5–15.5)
WBC: 9.6 10*3/uL (ref 4.0–10.5)

## 2017-03-28 LAB — URIC ACID: URIC ACID, SERUM: 6.7 mg/dL — AB (ref 2.3–6.6)

## 2017-03-28 LAB — PROTEIN / CREATININE RATIO, URINE
Creatinine, Urine: 47 mg/dL
Protein Creatinine Ratio: 0.15 mg/mg{Cre} (ref 0.00–0.15)
Total Protein, Urine: 7 mg/dL

## 2017-03-28 LAB — RPR: RPR: NONREACTIVE

## 2017-03-28 NOTE — Lactation Note (Signed)
This note was copied from a baby's chart. Lactation Consultation Note  Patient Name: Melinda Meza [id number] Date: 03/28/2017 Reason for consult: Initial assessment;Nipple pain/trauma   Initial consult with first time mom of 25 hour old infant. Infant with 8 BF for 10-60 minutes, EBM x 1 of 15 cc via spoon, 4 voids and 0 stools in last 24 hours. Infant was moderate MSF. LATCH scores 5-8. Infant weight 7 lb 11.1 oz.   Mom with semi flat nipples that evert with stimulation. Breasts and areola are semi compressible. Mom reports her nipples are sore with feedings. She reports she pumped x 1 with manual pump and was able to obtain colostrum to spoon feed infant. Enc her to rest nipples x 1-2 feeds if needed by pumping and spoon feeding infant. Enc mom to pump/ hand express post BF and offer infant dessert of EBM since infant not stooling.   Infant was awakened to feed, she awakened with diaper change. Mom when latched her to the right breast in the cross cradle hold. Infant is noted to have nasal stuffiness. Infant was on and off the breast initially and after several tries she did latch deeply and began to feed. She was noted to have intermittent swallows with feeding. Enc mom to keep infant awake at breast by using awakening techniques and massaging breasts. Enc mom to apply EBM and then coconut oil to nipples post BF.   Mom was given breast shells with instructions for use and cleaning to wear between feeds to assist with everting nipple prior to latch. Mom also has a manual pump and was informed she can also use manual pump before latch to assist with everting nipples. Mom voiced understanding. Discussed colostrum, milk coming to volume and normalcy of cluster feeding.   Enc mom to feed infant STS 8-12 x in 24 hours at first feeding cues. Enc mom to use good pillow and head support with feeding and to keep infant pulled close to breast with feeding.   BF resources handout and LC Brochure given,  mom informed of IP/OP Services, BF Support Groups and LC phone #. Mom is a [facility] client and has already spoken with them today. She reports her mom is bringing her a pump tomorrow.   Mom reports she has not further questions/concerns at this time. Enc mom to call out for feeding assistance as needed.    Maternal Data Formula Feeding for Exclusion: No Has patient been taught Hand Expression?: Yes Does the patient have breastfeeding experience prior to this delivery?: No  Feeding Feeding Type: Breast Fed Length of feed: 20 min  LATCH Score Latch: Repeated attempts needed to sustain latch, nipple held in mouth throughout feeding, stimulation needed to elicit sucking reflex.  Audible Swallowing: A few with stimulation  Type of Nipple: Flat  Comfort (Breast/Nipple): Filling, red/small blisters or bruises, mild/mod discomfort  Hold (Positioning): Assistance needed to correctly position infant at breast and maintain latch.  LATCH Score: 5  Interventions Interventions: Breast feeding basics reviewed;Support pillows;Assisted with latch;Position options;Skin to skin;Expressed milk;Breast massage;Coconut oil;Hand express;Pre-pump if needed;Breast compression;Hand pump  Lactation Tools Discussed/Used     Consult Status Consult Status: Follow-up Date: 03/29/17 Follow-up type: In-patient    Melinda Meza 03/28/2017, 4:43 PM

## 2017-03-28 NOTE — Progress Notes (Signed)
CSW received consult for hx of Anxiety and Depression.  CSW met with MOB to offer support and complete assessment.   FOB was present and MOB stated permission to speak openly with him in the room.  He held baby while we spoke.  Bonding is evident in the way he held, took pictures and attended to baby's needs while CSW was in the room.  Both parents state they are excited about baby.  They report that they are very happy about becoming parents.  MOB said, "it's the best thing in the world."  FOB said, "it's a good feeling to become a father."  MOB states she labored and delivered baby naturally, without an epidural.  She described it as "crazy" and "a whirlwind."  She smiled as she spoke about becoming a mother.   MOB states that she was experiencing signs and symptoms of anxiety and depression during college and was being treated with Prozac and counseling through the behavioral health center at [employer] where she was a student.  She states she graduated with a Pre-Law degree and is now a student at [employer].  She states she stopped both mental health interventions when she got pregnant because she states she felt better.  She reports no symptoms at this time, but was attentive to information given by CSW. CSW provided education regarding the baby blues period vs. perinatal mood disorders, discussed treatment and gave resources for mental health follow up if concerns arise.  CSW recommends self-evaluation during the postpartum time period using the New Mom Checklist from Postpartum Progress, as well as Edinburgh Postnatal Depression Scale, and encouraged MOB to contact a medical professional if symptoms are noted at any time.   CSW provided review of Sudden Infant Death Syndrome (SIDS) precautions.   Consult was also for hx of marijuana use.  MOB reports no use in pregnancy.  Hospital drug screen policy reviewed.  Baby's UDS negative.  MOB has not had any positive screens in pregnancy.  MOB was negative on 09/20/16,  11/17/16 and 03/27/17.   CSW identifies no further need for intervention and no barriers to discharge at this time. 

## 2017-03-28 NOTE — Progress Notes (Signed)
Post Partum Day 1 SVD/ Manual removal of retained placenta  Subjective: no complaints, up ad lib, voiding, tolerating PO and + flatus  Denies headache visual disturbances ruq pain   Objective: Blood pressure 133/73, pulse 61, temperature 99.6 F (37.6 C), resp. rate 20, height  (1.676 m), weight 92.1 kg (203 lb), last menstrual period 06/21/2016, SpO2 99 %, unknown if currently breastfeeding.  Physical Exam:  General: alert, cooperative and no distress Lochia: appropriate Uterine Fundus: firm Incision: NA DVT Evaluation: No evidence of DVT seen on physical exam.   Recent Labs  03/27/17 1039 03/28/17 0520  HGB 13.6 13.7  HCT 38.2 37.9    Assessment/Plan: Ppd #1 bp elevated this AM - check PIH labs- normal  Thrombocytopenia - platelets stabe Routine Postpartum care Discharge home tomorrow   LOS: 1 day   Melinda Sowder J. 03/28/2017, 3:19 PM

## 2017-03-29 MED ORDER — OXYCODONE HCL 5 MG PO TABS: 5.0000 mg | ORAL_TABLET | ORAL | 0 refills | Status: AC | PRN

## 2017-03-29 NOTE — Discharge Instructions (Signed)

## 2017-03-29 NOTE — Lactation Note (Signed)
This note was copied from a baby's chart. Lactation Consultation Note  Patient Name: Melinda Meza RUEAV'W Date: 03/29/2017 Reason for consult: Follow-up assessment Baby at 44 hr of life. Mom stated dyad is not being d/c today. Upon entry baby was coming off the L breast in football. Mom reports baby is sleepy during the day but "eats a lot at night". Mom denies breast or nipple pain. She was very pleased with the amount of colostrum she can manually express. She reports baby does well with spoon feeding. Discussed baby behavior, feeding frequency, baby belly size, voids, wt loss, breast changes, and nipple care. Parents are aware of lactation services and support group. Mom will offer the breast on demand 8+/24hr and post express to spoon feed per volume guidelines as needed.     Maternal Data    Feeding Feeding Type: Breast Fed Length of feed: 28 min  LATCH Score Latch: Grasps breast easily, tongue down, lips flanged, rhythmical sucking. (per mom)  Audible Swallowing: A few with stimulation (per mom)  Type of Nipple: Flat  Comfort (Breast/Nipple): Soft / non-tender  Hold (Positioning): No assistance needed to correctly position infant at breast. (per mom)  LATCH Score: 8  Interventions Interventions: Hand express;Position options;Support pillows;Breast massage  Lactation Tools Discussed/Used     Consult Status Consult Status: Follow-up Date: 03/30/17 Follow-up type: In-patient    Rulon Eisenmenger 03/29/2017, 11:29 AM

## 2017-03-29 NOTE — Discharge Summary (Signed)
OB Discharge Summary     Patient Name: Melinda Meza DOB: July 19, 1990 MRN: [id number]  Date of admission: 03/27/2017 Delivering MD: Gerald Leitz   Date of discharge: 03/29/2017  Admitting diagnosis: LABOR 2) Thrombocytopenia  Intrauterine pregnancy: [redacted]w[redacted]d     Secondary diagnosis:  Active Problems:   Normal labor Retained Placenta  Additional problems:None     Discharge diagnosis: Term Pregnancy Delivered  2) Thrombocytopenia                                                                                               Post partum procedures:manual extraction of placenta  Augmentation: None  Complications: Retained Placenta   Hospital course:  Onset of Labor With Vaginal Delivery     26 y.o. yo G2P1001 at [redacted]w[redacted]d was admitted in Active Labor on 03/27/2017. Patient had an uncomplicated labor course as follows:  Membrane Rupture Time/Date: 11:35 AM ,03/27/2017   Intrapartum Procedures: Episiotomy: None [1]                                         Lacerations:  2nd degree [3]  Patient had a delivery of a Viable infant. 03/27/2017  Information for the patient's newborn:  Latori, Beggs [[id number]]  Delivery Method: Vaginal, Spontaneous Delivery (Filed from Delivery Summary)    Pateint had an uncomplicated postpartum course.  She is ambulating, tolerating a regular diet, passing flatus, and urinating well. Patient is discharged home in stable condition on 03/29/17.   Physical exam  Vitals:   03/28/17 1029 03/28/17 1641 03/28/17 2215 03/29/17 0510  BP: 133/73 133/81 134/71 137/77  Pulse:  72 (!) 55 63  Resp:  18  16  Temp:  98.2 F (36.8 C)  98.2 F (36.8 C)  TempSrc:  Oral  Oral  SpO2:  98%    Weight:      Height:       General: alert, cooperative and no distress Lochia: appropriate Uterine Fundus: firm Incision: N/A DVT Evaluation: No evidence of DVT seen on physical exam. Labs: Lab Results  Component Value Date   WBC 9.6 03/28/2017   HGB 13.7 03/28/2017   HCT 37.9 03/28/2017   MCV 87.9 03/28/2017   PLT 67 (L) 03/28/2017   CMP Latest Ref Rng & Units 03/28/2017  Glucose 65 - 99 mg/dL 82  BUN 6 - 20 mg/dL 8  Creatinine 7.82 - 9.56 mg/dL 2.13  Sodium 086 - 578 mmol/L 136  Potassium 3.5 - 5.1 mmol/L 3.9  Chloride 101 - 111 mmol/L 106  CO2 22 - 32 mmol/L 24  Calcium 8.9 - 10.3 mg/dL 11.4(H)  Total Protein 6.5 - 8.1 g/dL 5.9(L)  Total Bilirubin 0.3 - 1.2 mg/dL 0.7  Alkaline Phos 38 - 126 U/L 160(H)  AST 15 - 41 U/L 32  ALT 14 - 54 U/L 12(L)    Discharge instruction: per After Visit Summary and "Baby and Me Booklet".  After visit meds:  Allergies as of 03/29/2017      Reactions   Flagyl [  metronidazole] Nausea Only      Medication List    STOP taking these medications   predniSONE 50 MG tablet Commonly known as:  DELTASONE   valACYclovir 1000 MG tablet Commonly known as:  VALTREX     TAKE these medications   oxyCODONE 5 MG immediate release tablet Commonly known as:  ROXICODONE Take 1 tablet (5 mg total) by mouth every 4 (four) hours as needed for severe pain.   PNV PRENATAL PLUS MULTIVITAMIN 27-1 MG Tabs TAKE 1 TABLET BY MOUTH EVERY DAY       Diet: routine diet  Activity: Advance as tolerated. Pelvic rest for 6 weeks.   Outpatient follow up:2 weeks Follow up Appt:No future appointments. Follow up Visit:No Follow-up on file.  Postpartum contraception: Not Discussed  Newborn Data: Live born female  Birth Weight: 7 lb 13 oz (3545 g) APGAR: 9, 9  Newborn Delivery   Birth date/time:  03/27/2017 15:06:00 Delivery type:  Vaginal, Spontaneous Delivery      Baby Feeding: Breast Disposition:home with mother   03/29/2017 Jessee Avers., MD

## 2017-03-30 ENCOUNTER — Telehealth: Payer: Self-pay | Admitting: Hematology

## 2017-03-30 NOTE — Telephone Encounter (Signed)
Rescheduled the pt's hematology appt to 11/15 at 10am.

## 2017-03-31 LAB — BPAM RBC
BLOOD PRODUCT EXPIRATION DATE: 201810182359
BLOOD PRODUCT EXPIRATION DATE: 201810312359
Blood Product Expiration Date: 201811022359
ISSUE DATE / TIME: 201810110607
UNIT TYPE AND RH: 1700
Unit Type and Rh: 1700
Unit Type and Rh: 5100

## 2017-03-31 LAB — TYPE AND SCREEN
ABO/RH(D): B POS
Antibody Screen: NEGATIVE
UNIT DIVISION: 0
UNIT DIVISION: 0
Unit division: 0

## 2017-04-04 ENCOUNTER — Inpatient Hospital Stay (HOSPITAL_COMMUNITY): Payer: Medicaid Other

## 2017-04-07 ENCOUNTER — Telehealth: Payer: Self-pay

## 2017-04-07 NOTE — Telephone Encounter (Signed)
In basket sent to Doreene ElandAndrea Brailsford (New Patient Placement) to have this pt rescheduled for new hematology appt with lab work the week of 05/29/17 per Dr. Candise CheKale.

## 2017-04-10 ENCOUNTER — Encounter: Payer: Self-pay | Admitting: Hematology

## 2017-04-10 ENCOUNTER — Telehealth: Payer: Self-pay | Admitting: Hematology

## 2017-04-10 NOTE — Telephone Encounter (Signed)
Hematology appt has been rescheduled for the pt to see Dr. Candise CheKale on 12/10 at 10am. New letter mailed.

## 2017-05-04 ENCOUNTER — Encounter: Payer: Medicaid Other | Admitting: Hematology

## 2017-05-26 ENCOUNTER — Telehealth: Payer: Self-pay | Admitting: *Deleted

## 2017-05-26 NOTE — Telephone Encounter (Signed)
LVM with patient regarding changing apt due to upcoming inclement weather.  Advised pt to call if she wishes to reschedule apt.  Call back number provided.

## 2017-05-28 ENCOUNTER — Telehealth: Payer: Self-pay | Admitting: *Deleted

## 2017-05-28 NOTE — Telephone Encounter (Signed)
Attempted to call patient to let [facility] know that [facility]/2017 and that someone from the Cancer [facility] will call to reschedule appointment. Left voicemail with information.

## 2017-05-29 ENCOUNTER — Encounter: Payer: Medicaid Other | Admitting: Hematology

## 2017-05-30 ENCOUNTER — Encounter: Payer: Medicaid Other | Admitting: Hematology

## 2017-06-08 ENCOUNTER — Telehealth: Payer: Self-pay | Admitting: Hematology

## 2017-06-08 NOTE — Telephone Encounter (Signed)
Spoke with patient regarding appointments for Dec that were rescheduled and she stated that she no longer wished to R/S and further appointments at this time.

## 2017-10-31 ENCOUNTER — Other Ambulatory Visit (HOSPITAL_COMMUNITY)
Admission: RE | Admit: 2017-10-31 | Discharge: 2017-10-31 | Disposition: A | Discharge status: Home / Self Care | Payer: Medicaid Other | Source: Ambulatory Visit | Attending: Obstetrics and Gynecology | Admitting: Obstetrics and Gynecology

## 2017-10-31 ENCOUNTER — Other Ambulatory Visit: Payer: Self-pay | Admitting: Obstetrics and Gynecology

## 2017-10-31 DIAGNOSIS — Z124 Encounter for screening for malignant neoplasm of cervix: Secondary | ICD-10-CM | POA: Diagnosis present

## 2017-11-03 LAB — CYTOLOGY - PAP
CHLAMYDIA, DNA PROBE: NEGATIVE
HPV (WINDOPATH): DETECTED — AB
NEISSERIA GONORRHEA: NEGATIVE
